# Patient Record
Sex: Female | Born: 1995 | Race: Asian | Hispanic: No | Marital: Single | Smoking: Never smoker
Health system: Southern US, Community
[De-identification: ages and names within clinical notes are randomized; demographics above are authoritative.]

## PROBLEM LIST (undated history)

## (undated) DIAGNOSIS — F419 Anxiety disorder, unspecified: Secondary | ICD-10-CM

## (undated) DIAGNOSIS — F32A Depression, unspecified: Secondary | ICD-10-CM

## (undated) DIAGNOSIS — F329 Major depressive disorder, single episode, unspecified: Secondary | ICD-10-CM

---
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
## 2016-01-01 NOTE — Discharge Instructions (Signed)
Please call your primary care provider in the morning to schedule a follow up appointment. I would also recommend that you call the neurology clinic listed and schedule a follow up appointment with them as well.  Increase hydration.  Return to ER for new or worsening symptoms, any additional concerns.

## 2016-01-29 ENCOUNTER — Encounter: Payer: Self-pay | Admitting: Family Medicine

## 2016-01-29 ENCOUNTER — Ambulatory Visit (INDEPENDENT_AMBULATORY_CARE_PROVIDER_SITE_OTHER): Payer: Medicaid Other | Admitting: Family Medicine

## 2016-01-29 VITALS — BP 106/72 | HR 65 | Temp 98.0°F | Ht 62.0 in | Wt 109.0 lb

## 2016-01-29 DIAGNOSIS — D500 Iron deficiency anemia secondary to blood loss (chronic): Secondary | ICD-10-CM | POA: Diagnosis present

## 2016-01-29 DIAGNOSIS — N921 Excessive and frequent menstruation with irregular cycle: Secondary | ICD-10-CM | POA: Diagnosis not present

## 2016-01-29 LAB — CBC
HEMATOCRIT: 35.1 % (ref 34.0–46.0)
HEMOGLOBIN: 11.3 g/dL — AB (ref 11.5–15.3)
MCH: 22.7 pg — ABNORMAL LOW (ref 25.0–35.0)
MCHC: 32.2 g/dL (ref 31.0–36.0)
MCV: 70.6 fL — AB (ref 78.0–98.0)
MPV: 9.8 fL (ref 7.5–12.5)
Platelets: 227 10*3/uL (ref 140–400)
RBC: 4.97 MIL/uL (ref 3.80–5.10)
RDW: 16.4 % — AB (ref 11.0–15.0)
WBC: 4.1 10*3/uL — ABNORMAL LOW (ref 4.5–13.0)

## 2016-01-29 MED ORDER — NORGESTIMATE-ETH ESTRADIOL 0.25-35 MG-MCG PO TABS: 1.0000 | ORAL_TABLET | Freq: Every day | ORAL | 3 refills | Status: DC

## 2016-01-30 ENCOUNTER — Encounter: Payer: Self-pay | Admitting: Family Medicine

## 2016-01-30 DIAGNOSIS — N921 Excessive and frequent menstruation with irregular cycle: Secondary | ICD-10-CM | POA: Insufficient documentation

## 2016-01-30 DIAGNOSIS — D500 Iron deficiency anemia secondary to blood loss (chronic): Secondary | ICD-10-CM | POA: Insufficient documentation

## 2016-01-30 NOTE — Progress Notes (Signed)
   Subjective:    Patient ID: Beth Conner, female    DOB: 17-Nov-1995, 20 y.o.   MRN: [id number]  HPI Follow-up emergency Department visit for dehydration, anemia. She's feeling better. She has discontinued working at her part-time job as many hours as she had been. She's trying to stay hydrated. She's otherwise doing well.   Review of Systems Denies shortness of breath, no chest pain, no dizziness. Menstrual cycle irregular over the last few months having some bleeding midcycle last 3 months.    Objective:   Physical Exam Vital signs reviewed. GENERAL: Well-developed, well-nourished, no acute distress. CARDIOVASCULAR: Regular rate and rhythm no murmur gallop or rub LUNGS: Clear to auscultation bilaterally, no rales or wheeze. ABDOMEN: Soft positive bowel sounds          Assessment & Plan:

## 2016-01-30 NOTE — Assessment & Plan Note (Signed)
At the emergency department she had low hemoglobin as well as some signs of dehydration. Given her irregular menses recently, we discussed and we'll place her on OCPs. We'll check hemoglobin today .

## 2016-12-02 ENCOUNTER — Encounter: Payer: Self-pay | Admitting: Family Medicine

## 2016-12-02 ENCOUNTER — Ambulatory Visit (INDEPENDENT_AMBULATORY_CARE_PROVIDER_SITE_OTHER): Payer: Medicaid Other | Admitting: Family Medicine

## 2016-12-02 VITALS — BP 106/68 | HR 71 | Temp 98.4°F | Ht 62.5 in | Wt 109.4 lb

## 2016-12-02 DIAGNOSIS — F322 Major depressive disorder, single episode, severe without psychotic features: Secondary | ICD-10-CM

## 2016-12-02 DIAGNOSIS — D500 Iron deficiency anemia secondary to blood loss (chronic): Secondary | ICD-10-CM

## 2016-12-02 DIAGNOSIS — Z0000 Encounter for general adult medical examination without abnormal findings: Secondary | ICD-10-CM | POA: Diagnosis not present

## 2016-12-02 DIAGNOSIS — N921 Excessive and frequent menstruation with irregular cycle: Secondary | ICD-10-CM | POA: Diagnosis not present

## 2016-12-02 NOTE — Patient Instructions (Signed)
You look great! I will send you a note about your blood work. If you change your mind abouit the pills for menorrhagia or contraception, just call and leave me a message and I will call them in (no visit needed). Best of luck over the summer and in school!

## 2016-12-02 NOTE — Progress Notes (Signed)
    CHIEF COMPLAINT / HPI:   Here for checkup. Has just started seeing a new psychiatrist, Dr. Excell SeltzerBaker. She is also just started on 10 mg fluoxetine. She's not currently having any suicidal ideation intent or plan. She is [facility] [employer], living with her parents. 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 When she returns to school she will be a sophomore, likely studying nursing.  She reports no specific issues other than her depression and anxiety.  REVIEW OF SYSTEMS:  Review of Systems  Constitutional: Negative for activity chang; no  appetite change and no unexpected weight change.  Eyes: Negative for eye pain and no visual disturbance.  Neck: denies neck pain; no swallowing problems CV: No chest pain, no shortness of breath, no lower extremity edema. No change in exercise tolerance Respiratory: Negative for cough or wheezing.  No shortness of breath. Gastrointestinal: Negative for abdominal pain, no diarrhea and no  constipation.  Genitourinary: Negative for decreased urine volume and  no difficulty urinating.  Musculoskeletal: Negative for arthralgias. No muscle weakness. Skin: Negative for rash.  Psychiatric/Behavioral: Negative for behavioral problems; no sleep disturbance. Continues to have mild to moderate depression, difficulty focusing, anxiety.   OBJECTIVE:  Vital signs are reviewed.   Vital signs reviewed GENERALl: Well developed, well nourished, in no acute distress. HEENT: PERRLA, EOMI, sclerae are nonicteric NECK: Supple, FROM, without lymphadenopathy.  THYROID: normal without nodularity CAROTID ARTERIES: without bruits LUNGS: clear to auscultation bilaterally. No wheezes or rales. Normal respiratory effort HEART: Regular rate and rhythm, no murmurs. Distal pulses are bilaterally symmetrical, 2+. ABDOMEN: soft with positive bowel sounds. No masses noted MSK: MOE x 4. Normal muscle strength, bulk and tone. SKIN no rash. Normal temperature. NEURO: no focal  deficits. Normal gait. Normal balance.   ASSESSMENT / PLAN: Please see problem oriented charting for details

## 2016-12-02 NOTE — Assessment & Plan Note (Signed)
Had some lab work done by her psychiatrist and was told she needed to have it rechecked. They told her there was some abnormality in the white cells in the red cells. We'll recheck CBC today. She does have history of anemia secondary to menorrhagia.

## 2016-12-02 NOTE — Assessment & Plan Note (Signed)
Reports she has stopped the OCPs. Does not wish to have this currently refilled. Is not currently sexually active and plans no immediate future sexual activity.

## 2016-12-02 NOTE — Assessment & Plan Note (Signed)
[facility] is [facility]. We'll continue to follow up with them.

## 2016-12-03 LAB — CBC
Hematocrit: 35.7 % (ref 34.0–46.6)
Hemoglobin: 11.4 g/dL (ref 11.1–15.9)
MCH: 22.8 pg — AB (ref 26.6–33.0)
MCHC: 31.9 g/dL (ref 31.5–35.7)
MCV: 72 fL — AB (ref 79–97)
Platelets: 252 10*3/uL (ref 150–379)
RBC: 4.99 x10E6/uL (ref 3.77–5.28)
RDW: 17.3 % — ABNORMAL HIGH (ref 12.3–15.4)
WBC: 4.1 10*3/uL (ref 3.4–10.8)

## 2016-12-03 LAB — HIV ANTIBODY (ROUTINE TESTING W REFLEX): HIV Screen 4th Generation wRfx: NONREACTIVE

## 2016-12-04 ENCOUNTER — Encounter: Payer: Self-pay | Admitting: Family Medicine

## 2017-05-09 ENCOUNTER — Encounter (HOSPITAL_COMMUNITY): Payer: Self-pay | Admitting: Emergency Medicine

## 2017-05-09 ENCOUNTER — Emergency Department (HOSPITAL_COMMUNITY)
Admission: EM | Admit: 2017-05-09 | Discharge: 2017-05-09 | Disposition: A | Discharge status: Home / Self Care | Payer: Medicaid Other | Attending: Emergency Medicine | Admitting: Emergency Medicine

## 2017-05-09 DIAGNOSIS — Z79899 Other long term (current) drug therapy: Secondary | ICD-10-CM | POA: Insufficient documentation

## 2017-05-09 DIAGNOSIS — R319 Hematuria, unspecified: Secondary | ICD-10-CM | POA: Diagnosis present

## 2017-05-09 DIAGNOSIS — N3001 Acute cystitis with hematuria: Secondary | ICD-10-CM | POA: Insufficient documentation

## 2017-05-09 LAB — URINALYSIS, ROUTINE W REFLEX MICROSCOPIC
Bilirubin Urine: NEGATIVE
Glucose, UA: NEGATIVE mg/dL
Ketones, ur: NEGATIVE mg/dL
Nitrite: NEGATIVE
PROTEIN: 100 mg/dL — AB
Specific Gravity, Urine: 1.024 (ref 1.005–1.030)
pH: 6 (ref 5.0–8.0)

## 2017-05-09 LAB — WET PREP, GENITAL
CLUE CELLS WET PREP: NONE SEEN
SPERM: NONE SEEN
TRICH WET PREP: NONE SEEN
YEAST WET PREP: NONE SEEN

## 2017-05-09 LAB — RAPID HIV SCREEN (HIV 1/2 AB+AG)
HIV 1/2 Antibodies: NONREACTIVE
HIV-1 P24 ANTIGEN - HIV24: NONREACTIVE

## 2017-05-09 LAB — POC URINE PREG, ED: PREG TEST UR: NEGATIVE

## 2017-05-09 MED ORDER — PHENAZOPYRIDINE HCL 200 MG PO TABS: 200.0000 mg | ORAL_TABLET | Freq: Three times a day (TID) | ORAL | 0 refills | Status: AC

## 2017-05-09 MED ORDER — CEPHALEXIN 500 MG PO CAPS: 500.0000 mg | ORAL_CAPSULE | Freq: Two times a day (BID) | ORAL | 0 refills | Status: AC

## 2017-05-09 NOTE — ED Notes (Signed)
Declined W/C at D/C and was escorted to lobby by RN. 

## 2017-05-09 NOTE — ED Provider Notes (Signed)
[facility] EMERGENCY DEPARTMENT Provider Note   CSN: [id number] Arrival date & time: 05/09/17  1416     History   Chief Complaint Chief Complaint  Patient presents with  . Hematuria  . Urinary Tract Infection    HPI Beth Conner is a 21 y.o. female.  HPI   Beth Conner a 21 year old female with a history of depression, anxiety, allergies who presents the emergency department for evaluation of urinary frequency, dysuria and hematuria.  She states that she has had increased urinary frequency for the past 2 days with little urinary output.  Yesterday she noticed that there was some blood in the urine which concerned her.  She also endorses 5/10 constant cramping suprapubic pain.  She denies fever, flank pain, nausea/vomiting, diarrhea, vaginal discharge. She denies history of UTI.  Last menstrual period finished a week ago.  States that she recently became sexually active having sex with one female partner twice last week.  She reports condom use every time.  Is asking to be checked for STDs and pregnancy.  Past Medical History:  Diagnosis Date  . Anxiety   . Depression   . MDD (major depressive disorder)     Patient Active Problem List   Diagnosis Date Noted  . Anemia due to chronic blood loss 01/30/2016  . Menometrorrhagia 01/30/2016  . Parent-child relational problem 08/04/2013  . Anxiety disorder 08/04/2013  . MDD (major depressive disorder), single episode, severe (HCC) 07/29/2013  . Acne 07/13/2012  . Visit for preventive health examination 06/24/2011  . RHINITIS, ALLERGIC 09/02/2006    No past surgical history on file.  OB History    No data available       Home Medications    Prior to Admission medications   Medication Sig Start Date End Date Taking? Authorizing Provider  FLUoxetine (PROZAC) 10 MG capsule Take 10 mg by mouth every morning. 11/19/16   [provider]    Family History Family History  Problem Relation Age of Onset  .  Mental illness Father     Social History Social History   Tobacco Use  . Smoking status: Never Smoker  . Smokeless tobacco: Never Used  Substance Use Topics  . Alcohol use: No  . Drug use: No     Allergies   Patient has no known allergies.   Review of Systems Review of Systems  Constitutional: Negative for chills, fatigue and fever.  Respiratory: Negative for shortness of breath.   Cardiovascular: Negative for chest pain.  Gastrointestinal: Positive for abdominal pain (suprapubic pain). Negative for diarrhea, nausea and vomiting.  Genitourinary: Positive for dysuria, frequency and hematuria. Negative for difficulty urinating, flank pain, menstrual problem, vaginal bleeding and vaginal discharge.  Musculoskeletal: Negative for gait problem.  Skin: Negative for rash.  Neurological: Negative for headaches.  Psychiatric/Behavioral: Negative for agitation.  All other systems reviewed and are negative.    Physical Exam Updated Vital Signs BP 123/78 (BP Location: Left Arm)   Pulse 68   Temp 97.9 F (36.6 C) (Oral)   Resp 14   Ht 5\' 2"  (1.575 m)   Wt 49 kg (108 lb)   SpO2 100%   BMI 19.75 kg/m   Physical Exam  Constitutional: She is oriented to person, place, and time. She appears well-developed and well-nourished. No distress.  Non-toxic appearing in no acute distress.  Sitting at the bedside, answering questions.  HENT:  Head: Normocephalic and atraumatic.  Mouth/Throat: Oropharynx is clear and moist.  Eyes: Conjunctivae are  normal. Pupils are equal, round, and reactive to light. Right eye exhibits no discharge. Left eye exhibits no discharge.  Neck: Normal range of motion. Neck supple.  Cardiovascular: Normal rate, regular rhythm and intact distal pulses. Exam reveals no friction rub.  No murmur heard. Pulmonary/Chest: Effort normal and breath sounds normal. No respiratory distress. She has no wheezes. She has no rales.  Abdominal: Soft. Bowel sounds are normal.  There is no tenderness. There is no rebound and no guarding.  No CVA tenderness.  Genitourinary:  Genitourinary Comments: Chaperone present for exam. Milky white discharge noted in the vaginal vault. No CMT. No adnexal masses, tenderness, or fullness.  No bleeding within vaginal vault.  Musculoskeletal: Normal range of motion.  Lymphadenopathy:    She has no cervical adenopathy.  Neurological: She is alert and oriented to person, place, and time. Coordination normal.  Skin: Skin is warm and dry. She is not diaphoretic.  Psychiatric: She has a normal mood and affect. Her behavior is normal.     ED Treatments / Results  Labs (all labs ordered are listed, but only abnormal results are displayed) Labs Reviewed  URINALYSIS, ROUTINE W REFLEX MICROSCOPIC - Abnormal; Notable for the following components:      Result Value   APPearance CLOUDY (*)    Hgb urine dipstick LARGE (*)    Protein, ur 100 (*)    Leukocytes, UA LARGE (*)    Bacteria, UA FEW (*)    Squamous Epithelial / LPF 0-5 (*)    Non Squamous Epithelial 0-5 (*)    All other components within normal limits  WET PREP, GENITAL  RAPID HIV SCREEN (HIV 1/2 AB+AG)  RPR  POC URINE PREG, ED  GC/CHLAMYDIA PROBE AMP ([place]) NOT AT [facility]    EKG  EKG Interpretation None       Radiology No results found.  Procedures Procedures (including critical care time)  Medications Ordered in ED Medications - No data to display   Initial Impression / Assessment and Plan / ED Course  I have reviewed the triage vital signs and the nursing notes.  Pertinent labs & imaging results that were available during my care of the patient were reviewed by me and considered in my medical decision making (see chart for details).    Patient presents with urinary frequency, dysuria and hematuria.  She denies fever, flank pain.  No CVA tenderness on exam.  UA infected  (cloudy with too numerous to count WBCs and RBCs.)  Urine pregnancy negative.   We will treat for UTI with antibiotics and pyridium.  Exam non-concerning for pyelonephritis given no CVA tenderness, report of flank pain or fever.  Patient has recently become sexually active and is requesting STD testing. Pelvic exam with some white milky discharge, no CMT or adnexal tenderness to suggest PID.  Sign out given to Dr. Ethelda ChickJacubowitz with wet prep pending.   Final Clinical Impressions(s) / ED Diagnoses   Final diagnoses:  None    New Prescriptions This SmartLink is deprecated. Use AVSMEDLIST instead to display the medication list for a patient.   Kellie ShropshireShrosbree, [place] Hagger J, PA-C 05/10/17 1125    Doug SouJacubowitz, Sam, MD 05/10/17 1407

## 2017-05-09 NOTE — ED Triage Notes (Signed)
Pt states 2 days of frequent small amounts of urination with blood in urine. Denies vaginal discharge or vaginal bleeding. Afebrile. No flank pain.

## 2017-05-09 NOTE — Discharge Instructions (Addendum)
Your urine is infected.  We will treat you for a bladder infection with antibiotic.  Please fill this prescription and take 2 times a day for the next 7 days.  Please take all of your antibiotics until finished!   You may develop abdominal discomfort or diarrhea from the antibiotic.  You may help offset this with probiotics which you can buy or get in yogurt. Do not eat  or take the probiotics until 2 hours after your antibiotic.  See Dr. Jennette KettleNeal if not improving in 4 or 5 days or you can return to the emergency department if concern for any reason  I also wrote you a prescription for pyridium which is a medicine that helps with painful urination. Please take this tree times a day for the next two days.   The pregnancy test was negative.  You have chlamydia, gonorrhea and syphilis testing which is pending.  If these tests are positive you will get a phone call within the next 3 days, you will not receive a call if they are negative.  Please return to the emergency department if you develop a fever greater than 100.4 F despite taking antibiotics, have worsening stomach pain with vomiting that will not stop or have any new or worsening symptoms.

## 2017-05-09 NOTE — ED Provider Notes (Signed)
Planes of painful urination and burning at urethral meatus with urination.  No other complaint.  Patient alert well-appearing no distress Results for orders placed or performed during the hospital encounter of 05/09/17  Wet prep, genital  Result Value Ref Range   Yeast Wet Prep HPF POC NONE SEEN NONE SEEN   Trich, Wet Prep NONE SEEN NONE SEEN   Clue Cells Wet Prep HPF POC NONE SEEN NONE SEEN   WBC, Wet Prep HPF POC MANY (A) NONE SEEN   Sperm NONE SEEN   Urinalysis, Routine w reflex microscopic- may I&O cath if menses  Result Value Ref Range   Color, Urine YELLOW YELLOW   APPearance CLOUDY (A) CLEAR   Specific Gravity, Urine 1.024 1.005 - 1.030   pH 6.0 5.0 - 8.0   Glucose, UA NEGATIVE NEGATIVE mg/dL   Hgb urine dipstick LARGE (A) NEGATIVE   Bilirubin Urine NEGATIVE NEGATIVE   Ketones, ur NEGATIVE NEGATIVE mg/dL   Protein, ur 119100 (A) NEGATIVE mg/dL   Nitrite NEGATIVE NEGATIVE   Leukocytes, UA LARGE (A) NEGATIVE   RBC / HPF TOO NUMEROUS TO COUNT 0 - 5 RBC/hpf   WBC, UA TOO NUMEROUS TO COUNT 0 - 5 WBC/hpf   Bacteria, UA FEW (A) NONE SEEN   Squamous Epithelial / LPF 0-5 (A) NONE SEEN   WBC Clumps PRESENT    Mucus PRESENT    Non Squamous Epithelial 0-5 (A) NONE SEEN  POC urine preg, ED  Result Value Ref Range   Preg Test, Ur NEGATIVE NEGATIVE   No results found.   Doug SouJacubowitz, Isael Stille, MD 05/09/17 1755

## 2017-05-10 LAB — GC/CHLAMYDIA PROBE AMP (~~LOC~~) NOT AT ARMC
CHLAMYDIA, DNA PROBE: NEGATIVE
Neisseria Gonorrhea: NEGATIVE

## 2017-05-10 LAB — RPR: RPR Ser Ql: NONREACTIVE

## 2017-11-24 ENCOUNTER — Ambulatory Visit (INDEPENDENT_AMBULATORY_CARE_PROVIDER_SITE_OTHER): Payer: Medicaid Other | Admitting: Family Medicine

## 2017-11-24 ENCOUNTER — Encounter: Payer: Self-pay | Admitting: Family Medicine

## 2017-11-24 ENCOUNTER — Other Ambulatory Visit: Payer: Self-pay

## 2017-11-24 VITALS — BP 94/62 | HR 63 | Temp 98.1°F | Ht 63.0 in | Wt 108.2 lb

## 2017-11-24 DIAGNOSIS — M545 Low back pain, unspecified: Secondary | ICD-10-CM

## 2017-11-24 NOTE — Progress Notes (Signed)
    CHIEF COMPLAINT / HPI: Back pain for the last 2 months.  Midline low back pain.  Worse with extended periods of sitting.  Feels stiff.  Exact area pain seems to move around her left to right sometimes it is tender to touch.  No bowel or bladder changes.  No leg numbness.  No lower extremity weakness.  REVIEW OF SYSTEMS: See HPI  PERTINENT  PMH / PSH: I have reviewed the patient's medications, allergies, past medical and surgical history, smoking status and updated in the EMR as appropriate.   OBJECTIVE: GENERAL: Well-developed female no acute distress BACK: No defect is noted.  Mildly tender to palpation diffusely in the lumbar muscle area.  The lumbar vertebral are nontender to percussion.  She has normal flexion at the hips.  She has some mild pain with hyperextension but has full range of motion.  Lateral rotation does not cause pain.  She can bend over and almost touch her toes but this causes a pulling sensation in her low back.   EXTREMITY: Lower extremity strength 5 out of 5 flexors and extensors of the hip, knee, ankle symmetrically.  DTR: 2+ bilateral symmetrical knee and ankle. SKIN: Skin of the lower back was without any unusual warmth, no redness, no rash.  ASSESSMENT / PLAN: Musculoskeletal low back pain probably related to long periods of sitting she is a very Freight forwarder.  We discussed at length.  Her father was present for the entire office visit.  I gave her handout on back exercises focusing on hyperextension activities.  She has been using some over-the-counter Aleve for that occasionally and that is fine stomach upset. If she is  Diligent with the exercises she should improve in the next 2 to 4 weeks.  If the symptoms are not resolving as we suspect, she will return to clinic.

## 2018-12-02 ENCOUNTER — Ambulatory Visit (INDEPENDENT_AMBULATORY_CARE_PROVIDER_SITE_OTHER): Payer: Self-pay | Admitting: *Deleted

## 2018-12-02 ENCOUNTER — Other Ambulatory Visit: Payer: Self-pay

## 2018-12-02 DIAGNOSIS — Z111 Encounter for screening for respiratory tuberculosis: Secondary | ICD-10-CM

## 2018-12-02 NOTE — Progress Notes (Signed)
Patient is here for a PPD placement.  PPD placed in Right forearm (scars on left arm).  Patient will return 12/05/18 to have PPD read. Jone Baseman, CMA

## 2018-12-05 ENCOUNTER — Other Ambulatory Visit: Payer: Self-pay

## 2018-12-05 ENCOUNTER — Ambulatory Visit (INDEPENDENT_AMBULATORY_CARE_PROVIDER_SITE_OTHER): Payer: Self-pay | Admitting: *Deleted

## 2018-12-05 ENCOUNTER — Ambulatory Visit (INDEPENDENT_AMBULATORY_CARE_PROVIDER_SITE_OTHER): Payer: Self-pay | Admitting: Family Medicine

## 2018-12-05 ENCOUNTER — Encounter: Payer: Self-pay | Admitting: Family Medicine

## 2018-12-05 VITALS — BP 85/70 | HR 75

## 2018-12-05 DIAGNOSIS — R590 Localized enlarged lymph nodes: Secondary | ICD-10-CM

## 2018-12-05 DIAGNOSIS — Z111 Encounter for screening for respiratory tuberculosis: Secondary | ICD-10-CM

## 2018-12-05 DIAGNOSIS — L219 Seborrheic dermatitis, unspecified: Secondary | ICD-10-CM

## 2018-12-05 DIAGNOSIS — Z021 Encounter for pre-employment examination: Secondary | ICD-10-CM

## 2018-12-05 LAB — TB SKIN TEST
Induration: 0 mm
TB Skin Test: NEGATIVE

## 2018-12-05 MED ORDER — KETOCONAZOLE 2 % EX SHAM
1.0000 | MEDICATED_SHAMPOO | CUTANEOUS | 0 refills | Status: AC
Start: 2018-12-05 — End: ?

## 2018-12-05 NOTE — Progress Notes (Signed)
Patient Name: Beth Conner Date of Birth: 06/29/1996 Date of Visit: 12/05/18 PCP: Nestor RampNeal, Sara L, MD  Chief Complaint: Lymph node on left side of neck  Subjective: Beth Conner is a pleasant 23 y.o. with medical history significant for anxiety presenting today for two weeks of lymphadenopathy, localized.  The patient reports a 2-week history of left-sided swollen " glands".  She reports these are painless and minimally tender.  No erythema associated with them, endorses a sore feeling over the area. She specifically denies ear pain, sore throat, dental infection, fevers, chills, shortness of breath cough.  She was sexually active in January with one female partner.  She regularly uses condoms.  No oral intercourse.  No IV drug use.  Denies smoking tobacco or marijuana products.  She drinks alcohol every 2 to 3 months.  She has no cats at home.  No recent travel.  Her main other concern is that she has had 2 months of flakes in her hair.  This is worsened over the last month where her scalp is very irritated and tender at times.  She has used a number of products of this.  She regularly uses Pantene shampoo and also uses matrix hair products to lock in coloring products.   The patient is currently working as a Conservator, museum/gallerycertified nurse assistant.  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 She recently had PPD placed in right arm.    ROS: As above.  ROS  I have reviewed the patient's medical, surgical, family, and social history as appropriate.   Vitals:   12/05/18 0918  BP: (!) 85/70  Pulse: 75  SpO2: 98%   Temp 98.1 F  HEENT: Sclera anicteric. Dentition is moderate. Appears well hydrated.  Oropharynx moderate dentition.  She does have a dental carry on the right side on the mandibular surface.  Tympanic membrane's both clear without effusion.  Oropharynx without exudate.  neck: 1.5 cm lymph node in posterior cervical chain, left sided, mobile, minimally tender, no erythema  There is a small 0.5 cm lymph node in the right anterior cervical lymph chain.  No axillary adenopathy no epitrochlear lymph nodes Cardiac: Regular rate and rhythm. Normal S1/S2. No murmurs, rubs, or gallops appreciated. Lungs: Clear bilaterally to ascultation.  Abdomen: Normoactive bowel sounds. No tenderness to deep or light palpation. No rebound or guarding.  Extremities: Warm, well perfused without edema.  Skin: Warm, dry Psych: Pleasant and appropriate    Ghazal was seen today for lumps on neck.  Diagnoses and all orders for this visit:  Lymphadenopathy, cervical most likely reactive lymph adenopathy in the setting of quite moderate to severe seborrhea.  Differential also includes common bacterial pathogens as this is only been ongoing for 2 weeks of time.  Differential must also include typical immunosuppressive viruses.  Epstein-Barr virus certainly possible as well.  Will start with basic lab evaluation as above.  She is to return to care for reexamination in 2 weeks.  If this is still present consider testing for Epstein-Barr virus and further evaluation and potentially imaging. -     HIV antibody (with reflex) -     RPR -     Hepatitis c antibody (reflex) -     CBC with Differential  Seborrhea -     ketoconazole (NIZORAL) 2 % shampoo; Apply 1 application topically 2 (two) times a week. Leave on three to five minutes then rinse  Need for history and physical examination for employment -  Hepatitis B surface antibody,quantitative   Terisa Starr, MD  [facility] Medicine Teaching Service

## 2018-12-05 NOTE — Patient Instructions (Addendum)
It was wonderful to see you today.  Thank you for choosing [facility].   Please call [phone] with any questions about today's appointment.  Please be sure to schedule follow up at the front  desk before you leave today.   Terisa Starr, MD  Family Medicine     Your prescription is at your pharmacy  I will call you with labs results

## 2018-12-05 NOTE — Progress Notes (Signed)
Patient is here for a PPD read.  It was placed on 12/01/17 in the right forearm (due to scaring on the left) @ 2:10pm.    PPD RESULTS:  Result: Negative Induration: 0 mm  Letter created and given to patient for documentation purposes. Jone Baseman, CMA

## 2018-12-05 NOTE — Progress Notes (Deleted)
  Patient Name: Kiersti Faul Date of Birth: 10/01/1995 Date of Visit: 12/05/18 PCP: Nestor Ramp, MD  Chief Complaint:   Subjective: Nazli Caseres is a pleasant 23 y.o. with medical history significant for *** presenting today for ***.     ROS:  ROS  I have reviewed the patient's medical, surgical, family, and social history as appropriate.   Vitals:   12/05/18 0918  BP: (!) 85/70  Pulse: 75  SpO2: 98%   There were no vitals filed for this visit.   Clemmie was seen today for lumps on neck.  Diagnoses and all orders for this visit:  Lymphadenopathy, cervical -     HIV antibody (with reflex) -     RPR -     Hepatitis c antibody (reflex) -     CBC with Differential  Seborrhea -     ketoconazole (NIZORAL) 2 % shampoo; Apply 1 application topically 2 (two) times a week. Leave on three to five minutes then rinse  Need for history and physical examination for employment -     Hepatitis B surface antibody,quantitative    Terisa Starr, MD  [facility] Medicine Teaching Service

## 2018-12-06 ENCOUNTER — Other Ambulatory Visit: Payer: Self-pay | Admitting: Family Medicine

## 2018-12-06 ENCOUNTER — Telehealth: Payer: Self-pay | Admitting: Family Medicine

## 2018-12-06 DIAGNOSIS — Z9229 Personal history of other drug therapy: Secondary | ICD-10-CM

## 2018-12-06 LAB — CBC WITH DIFFERENTIAL/PLATELET
Basophils Absolute: 0 10*3/uL (ref 0.0–0.2)
Basos: 1 %
EOS (ABSOLUTE): 0.1 10*3/uL (ref 0.0–0.4)
Eos: 3 %
Hematocrit: 39.9 % (ref 34.0–46.6)
Hemoglobin: 13 g/dL (ref 11.1–15.9)
Immature Grans (Abs): 0 10*3/uL (ref 0.0–0.1)
Immature Granulocytes: 0 %
Lymphocytes Absolute: 1.5 10*3/uL (ref 0.7–3.1)
Lymphs: 41 %
MCH: 24.4 pg — ABNORMAL LOW (ref 26.6–33.0)
MCHC: 32.6 g/dL (ref 31.5–35.7)
MCV: 75 fL — ABNORMAL LOW (ref 79–97)
Monocytes Absolute: 0.2 10*3/uL (ref 0.1–0.9)
Monocytes: 5 %
Neutrophils Absolute: 1.9 10*3/uL (ref 1.4–7.0)
Neutrophils: 50 %
Platelets: 210 10*3/uL (ref 150–450)
RBC: 5.32 x10E6/uL — ABNORMAL HIGH (ref 3.77–5.28)
RDW: 15.3 % (ref 11.7–15.4)
WBC: 3.7 10*3/uL (ref 3.4–10.8)

## 2018-12-06 LAB — HEPATITIS B SURFACE ANTIBODY, QUANTITATIVE: Hepatitis B Surf Ab Quant: <3.1 m[IU]/mL — ABNORMAL LOW (ref >9.9)

## 2018-12-06 LAB — RPR: RPR Ser Ql: NONREACTIVE

## 2018-12-06 LAB — HIV ANTIBODY (ROUTINE TESTING W REFLEX): HIV Screen 4th Generation wRfx: NONREACTIVE

## 2018-12-06 LAB — HEPATITIS C ANTIBODY (REFLEX): HCV Ab: <0.1 s/co ratio (ref 0.0–0.9)

## 2018-12-06 LAB — HCV COMMENT:

## 2018-12-06 NOTE — Telephone Encounter (Signed)
Called patient with results. All results reassuring microcytosis is present which is been present previously, this is likely due to iron deficiency or possibly a thalassemia trait.  There is no anemia.  Recommend an iron rich diet.  HIV syphilis and hepatitis C all negative no suggestions of abnormal white blood cell count suggestive of infection.  Recommended treatment of seborrhea.  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  The patient will need a vaccine for this.  The vaccine is been ordered  Nursing please call the patient to schedule her visit for her hepatitis B vaccine.  She will bring the paperwork with her -- please make a copy and place in my box.  Let me know if you have questions.   Terisa Starr, MD  Family Medicine Teaching Service

## 2018-12-06 NOTE — Progress Notes (Signed)
Hep B vaccine ordered.

## 2018-12-07 ENCOUNTER — Other Ambulatory Visit: Payer: Self-pay

## 2018-12-07 ENCOUNTER — Telehealth: Payer: Self-pay | Admitting: *Deleted

## 2018-12-07 ENCOUNTER — Ambulatory Visit (INDEPENDENT_AMBULATORY_CARE_PROVIDER_SITE_OTHER): Payer: Self-pay | Admitting: *Deleted

## 2018-12-07 VITALS — Temp 98.4°F

## 2018-12-07 DIAGNOSIS — Z9229 Personal history of other drug therapy: Secondary | ICD-10-CM

## 2018-12-07 DIAGNOSIS — Z23 Encounter for immunization: Secondary | ICD-10-CM

## 2018-12-07 NOTE — Telephone Encounter (Signed)
Repeat titer ordered. Thank you for counting out the time/date with the patient.   Terisa Starr, MD  Family Medicine Teaching Service

## 2018-12-07 NOTE — Progress Notes (Signed)
Pt came in for Hep B booster due to no immunity.  Instructed pt that she needed to wait 4 weeks to come back for second dose.  She stated that she would do a second titer to see if this shot gives her immunity and if not will get the second done around first week in July.  Routing a message to doctor who ordered original titer and have them place a future order for another one to be taken. Beth Conner, CMA

## 2018-12-07 NOTE — Addendum Note (Signed)
Addended by: Manson Passey, CARINA on: 12/07/2018 01:34 PM   Modules accepted: Orders

## 2018-12-07 NOTE — Telephone Encounter (Signed)
Pt in today for Hepatitis B immunization in hope of reaching immunity. Pt will come in in 4 weeks for another titer. Will you please place a future lab order for this.  The plan is to check titer right at 4 wks for immunity (1st week in July) if no immunity 2nd dose to be given week of 7/6-7/10, in hopes of immunity to be checked again 4-5 weeks later so pt can possibly start program in August. Ezariah Nace Lamonte Sakai, CMA

## 2018-12-21 ENCOUNTER — Encounter: Payer: Self-pay | Admitting: Family Medicine

## 2018-12-21 ENCOUNTER — Other Ambulatory Visit: Payer: Self-pay

## 2018-12-21 ENCOUNTER — Ambulatory Visit (INDEPENDENT_AMBULATORY_CARE_PROVIDER_SITE_OTHER): Payer: Self-pay | Admitting: Family Medicine

## 2018-12-21 VITALS — BP 98/58 | HR 73 | Temp 98.1°F | Ht 62.0 in | Wt 108.0 lb

## 2018-12-21 DIAGNOSIS — Z23 Encounter for immunization: Secondary | ICD-10-CM

## 2018-12-21 DIAGNOSIS — Z0000 Encounter for general adult medical examination without abnormal findings: Secondary | ICD-10-CM

## 2018-12-21 NOTE — Patient Instructions (Signed)
You can come for a nurse visit at the first week of July and get your second Tb test for the two step test your school requires. At that nurse visit go ahead and get your lab drawn for the hepatitis B titer. Once that is drawn I will send you a note.  Be sure and bring your form for school to be signed when you get the second TB test read. Great  To see you!

## 2018-12-26 DIAGNOSIS — Z0000 Encounter for general adult medical examination without abnormal findings: Secondary | ICD-10-CM | POA: Insufficient documentation

## 2018-12-26 NOTE — Progress Notes (Signed)
    CHIEF COMPLAINT / HPI: Needs paperwork filled out for school.  Has some questions about Hepatitus B titers.  REVIEW OF SYSTEMS: Review of Systems  Constitutional: Negative for activity chang; no  appetite change and no unexpected weight change.  Eyes: Negative for eye pain and no visual disturbance.  Neck: denies neck pain; no swallowing problems CV: No chest pain, no shortness of breath, no lower extremity edema. No change in exercise tolerance Respiratory: Negative for cough or wheezing.  No shortness of breath. Gastrointestinal: Negative for abdominal pain, no diarrhea and no  constipation.  Genitourinary: Negative for decreased urine volume and  no difficulty urinating.  Musculoskeletal: Negative for arthralgias. No muscle weakness. Skin: Negative for rash.  Psychiatric/Behavioral: Negative for behavioral problems; no sleep disturbance and no  agitation.    PERTINENT  PMH / PSH: I have reviewed the patient's medications, allergies, past medical and surgical history, smoking status and updated in the EMR as appropriate.   OBJECTIVE:  Vital signs reviewed GENERALl: Well developed, well nourished, in no acute distress. HEENT: PERRLA, EOMI, sclerae are nonicteric NECK: Supple, FROM, without lymphadenopathy.  THYROID: normal without nodularity CAROTID ARTERIES: without bruits LUNGS: clear to auscultation bilaterally. No wheezes or rales. Normal respiratory effort HEART: Regular rate and rhythm, no murmurs. Distal pulses are bilaterally symmetrical, 2+. ABDOMEN: soft with positive bowel sounds. No masses noted MSK: MOE x 4. Normal muscle strength, bulk and tone. SKIN no rash. Normal temperature. NEURO: no focal deficits. Normal gait. Normal balance. PSYCH: AxOx4. Good eye contact.. No psychomotor retardation or agitation. Appropriate speech fluency and content. Asks and answers questions appropriately. Mood is congruent.   ASSESSMENT / PLAN:  No problem-specific Assessment &  Plan notes found for this encounter.

## 2018-12-26 NOTE — Assessment & Plan Note (Signed)
Filled out her college paperwork.  Arrange for her to get second titer and two-step TB test

## 2019-01-16 ENCOUNTER — Ambulatory Visit (INDEPENDENT_AMBULATORY_CARE_PROVIDER_SITE_OTHER): Payer: Self-pay | Admitting: *Deleted

## 2019-01-16 ENCOUNTER — Other Ambulatory Visit: Payer: Self-pay

## 2019-01-16 DIAGNOSIS — Z9229 Personal history of other drug therapy: Secondary | ICD-10-CM

## 2019-01-16 DIAGNOSIS — Z0000 Encounter for general adult medical examination without abnormal findings: Secondary | ICD-10-CM

## 2019-01-16 DIAGNOSIS — Z111 Encounter for screening for respiratory tuberculosis: Secondary | ICD-10-CM

## 2019-01-16 NOTE — Progress Notes (Signed)
Pt is here for the 2nd part of the 2 step TB test and also to have Hep B Titer (See phone note from 12/07/18).  PPD placed in right forearm (due to scarring on left arm).  Patient will return 01/18/19 to have PPD read. Christen Bame, CMA

## 2019-01-17 LAB — HEPATITIS B SURFACE ANTIBODY, QUANTITATIVE: Hepatitis B Surf Ab Quant: >1000 m[IU]/mL (ref >9.9)

## 2019-01-17 LAB — HEPATITIS B SURFACE ANTIBODY,QUALITATIVE: Hep B Surface Ab, Qual: REACTIVE

## 2019-01-18 ENCOUNTER — Other Ambulatory Visit: Payer: Self-pay

## 2019-01-18 ENCOUNTER — Ambulatory Visit: Payer: Medicaid Other

## 2019-01-18 DIAGNOSIS — Z111 Encounter for screening for respiratory tuberculosis: Secondary | ICD-10-CM

## 2019-01-18 LAB — TB SKIN TEST
Induration: 0 mm
TB Skin Test: NEGATIVE

## 2019-01-18 NOTE — Progress Notes (Signed)
PPD Reading Note PPD read and results entered in [place]. Result: 0 mm induration. Interpretation: Negative Allergic reaction: No  Labs printed from HepB titers.

## 2019-01-31 ENCOUNTER — Ambulatory Visit: Payer: Medicaid Other | Admitting: *Deleted

## 2019-01-31 ENCOUNTER — Other Ambulatory Visit: Payer: Self-pay

## 2019-02-06 ENCOUNTER — Encounter: Payer: Self-pay | Admitting: Family Medicine

## 2019-12-25 ENCOUNTER — Telehealth: Payer: Self-pay | Admitting: Family Medicine

## 2019-12-25 DIAGNOSIS — Z111 Encounter for screening for respiratory tuberculosis: Secondary | ICD-10-CM

## 2019-12-25 NOTE — Telephone Encounter (Signed)
Patient is calling and would like to know if Dr. Jennette Kettle could place an order for her to have quantiferon gold test done for her employer.   Once the order is placed she would like someone to call her so she can get this appointment scheduled.   The best callback is [phone].

## 2019-12-25 NOTE — Telephone Encounter (Signed)
Dear [facility] Asters Team Please let her know the order is placed and she can come for a lab only appointment. THANKS! Denny Levy

## 2019-12-26 ENCOUNTER — Other Ambulatory Visit: Payer: Self-pay

## 2019-12-26 ENCOUNTER — Other Ambulatory Visit: Payer: Medicaid Other

## 2019-12-26 DIAGNOSIS — Z111 Encounter for screening for respiratory tuberculosis: Secondary | ICD-10-CM

## 2019-12-26 NOTE — Telephone Encounter (Signed)
Contacted pt and informed her of below and she said that she came in today to have that drawn.Beth Conner, CMA

## 2019-12-28 LAB — QUANTIFERON-TB GOLD PLUS
QuantiFERON Mitogen Value: >10 IU/mL
QuantiFERON Nil Value: 0.09 IU/mL
QuantiFERON TB1 Ag Value: 0.12 IU/mL
QuantiFERON TB2 Ag Value: 0.13 IU/mL
QuantiFERON-TB Gold Plus: NEGATIVE

## 2019-12-29 ENCOUNTER — Encounter: Payer: Self-pay | Admitting: Family Medicine

## 2020-11-18 ENCOUNTER — Other Ambulatory Visit: Payer: Self-pay

## 2020-11-18 ENCOUNTER — Encounter (HOSPITAL_COMMUNITY): Payer: Self-pay

## 2020-11-18 ENCOUNTER — Ambulatory Visit (HOSPITAL_COMMUNITY)
Admission: EM | Admit: 2020-11-18 | Discharge: 2020-11-18 | Disposition: A | Discharge status: Home / Self Care | Payer: 59 | Attending: Physician Assistant | Admitting: Physician Assistant

## 2020-11-18 DIAGNOSIS — J40 Bronchitis, not specified as acute or chronic: Secondary | ICD-10-CM

## 2020-11-18 DIAGNOSIS — J329 Chronic sinusitis, unspecified: Secondary | ICD-10-CM

## 2020-11-18 DIAGNOSIS — R059 Cough, unspecified: Secondary | ICD-10-CM

## 2020-11-18 MED ORDER — PREDNISONE 10 MG (21) PO TBPK: ORAL_TABLET | ORAL | 0 refills | Status: AC

## 2020-11-18 MED ORDER — ALBUTEROL SULFATE HFA 108 (90 BASE) MCG/ACT IN AERS
1.0000 | INHALATION_SPRAY | Freq: Four times a day (QID) | RESPIRATORY_TRACT | 0 refills | Status: AC | PRN
Start: 2020-11-18 — End: ?

## 2020-11-18 MED ORDER — AMOXICILLIN-POT CLAVULANATE 875-125 MG PO TABS: 1.0000 | ORAL_TABLET | Freq: Two times a day (BID) | ORAL | 0 refills | Status: AC

## 2020-11-18 NOTE — ED Triage Notes (Addendum)
Pt in with c/o productive cough and wheezing/sob when she inhales x 1 week  Pt has been taking Claritin with no relief  Expiratory wheezing noted

## 2020-11-18 NOTE — ED Provider Notes (Signed)
[facility]    CSN: [id number] Arrival date & time: 11/18/20  1053      History   Chief Complaint Chief Complaint  Patient presents with  . Cough  . Shortness of Breath    HPI Beth Conner is a 25 y.o. female.   Patient presents today with a several week history of productive cough.  Reports sputum as thick and dark.  She reports associated sinus pressure and shortness of breath.  She has experienced some wheezing.  She denies any fever, nausea, vomiting, headache, dizziness, chest pain.  She has tried over-the-counter medications without improvement of symptoms.  Denies any known sick contacts.  She is up-to-date on immunizations including flu and COVID.  She denies any recent antibiotics.  Denies history of asthma, COPD, smoking, allergies.  Reports symptoms are interfering with her ability perform daily activities and she has having difficulty sleeping at night due to symptoms.     Past Medical History:  Diagnosis Date  . Anxiety   . Depression   . MDD (major depressive disorder)     Patient Active Problem List   Diagnosis Date Noted  . Well adult exam 12/26/2018  . Anemia due to chronic blood loss 01/30/2016  . Menometrorrhagia 01/30/2016  . Parent-child relational problem 08/04/2013  . Anxiety disorder 08/04/2013  . MDD (major depressive disorder), single episode, severe (HCC) 07/29/2013  . Acne 07/13/2012  . Visit for preventive health examination 06/24/2011  . RHINITIS, ALLERGIC 09/02/2006    History reviewed. No pertinent surgical history.  OB History   No obstetric history on file.      Home Medications    Prior to Admission medications   Medication Sig Start Date End Date Taking? Authorizing Provider  albuterol (VENTOLIN HFA) 108 (90 Base) MCG/ACT inhaler Inhale 1-2 puffs into the lungs every 6 (six) hours as needed for wheezing or shortness of breath. 11/18/20  Yes Samayah Novinger K, PA-C  amoxicillin-clavulanate (AUGMENTIN) 875-125 MG tablet  Take 1 tablet by mouth every 12 (twelve) hours. 11/18/20  Yes Sonnie Pawloski K, PA-C  predniSONE (STERAPRED UNI-PAK 21 TAB) 10 MG (21) TBPK tablet As directed 11/18/20  Yes Luan Urbani K, PA-C  FLUoxetine (PROZAC) 10 MG capsule Take 10 mg by mouth every morning. 11/19/16   [provider]  ketoconazole (NIZORAL) 2 % shampoo Apply 1 application topically 2 (two) times a week. Leave on three to five minutes then rinse 12/05/18   Westley Chandler, MD  phenazopyridine (PYRIDIUM) 200 MG tablet Take 1 tablet (200 mg total) 3 (three) times daily by mouth. 05/09/17   Kellie Shropshire, PA-C    Family History Family History  Problem Relation Age of Onset  . Mental illness Father     Social History Social History   Tobacco Use  . Smoking status: Never Smoker  . Smokeless tobacco: Never Used  Substance Use Topics  . Alcohol use: No  . Drug use: No     Allergies   Patient has no known allergies.   Review of Systems Review of Systems  Constitutional: Negative for activity change, appetite change, fatigue and fever.  HENT: Positive for congestion and sinus pressure. Negative for sneezing and sore throat.   Respiratory: Positive for cough, chest tightness and shortness of breath. Negative for wheezing.   Cardiovascular: Negative for chest pain.  Gastrointestinal: Negative for abdominal pain, diarrhea, nausea and vomiting.  Musculoskeletal: Negative for arthralgias and myalgias.  Neurological: Negative for dizziness, light-headedness and headaches.  Physical Exam Triage Vital Signs ED Triage Vitals  Enc Vitals Group     BP 11/18/20 1215 109/72     Pulse Rate 11/18/20 1215 63     Resp 11/18/20 1215 17     Temp 11/18/20 1215 99.1 F (37.3 C)     Temp Source 11/18/20 1215 Oral     SpO2 11/18/20 1215 98 %     Weight --      Height --      Head Circumference --      Peak Flow --      Pain Score 11/18/20 1214 0     Pain Loc --      Pain Edu? --      Excl. in GC? --    No  data found.  Updated Vital Signs BP 109/72 (BP Location: Right Arm)   Pulse 63   Temp 99.1 F (37.3 C) (Oral)   Resp 17   LMP 11/04/2020 (Approximate)   SpO2 98%   Visual Acuity Right Eye Distance:   Left Eye Distance:   Bilateral Distance:    Right Eye Near:   Left Eye Near:    Bilateral Near:     Physical Exam Vitals reviewed.  Constitutional:      General: She is awake. She is not in acute distress.    Appearance: Normal appearance. She is not ill-appearing.     Comments: Very pleasant female appears stated age in no acute distress  HENT:     Head: Normocephalic and atraumatic.     Right Ear: Tympanic membrane, ear canal and external ear normal. Tympanic membrane is not erythematous or bulging.     Left Ear: Tympanic membrane, ear canal and external ear normal. Tympanic membrane is not erythematous or bulging.     Nose:     Right Sinus: No maxillary sinus tenderness or frontal sinus tenderness.     Left Sinus: No maxillary sinus tenderness or frontal sinus tenderness.     Mouth/Throat:     Pharynx: Uvula midline. No oropharyngeal exudate or posterior oropharyngeal erythema.  Cardiovascular:     Rate and Rhythm: Normal rate and regular rhythm.     Heart sounds: No murmur heard.   Pulmonary:     Effort: Pulmonary effort is normal.     Breath sounds: Normal breath sounds. No wheezing, rhonchi or rales.     Comments: Clear to auscultation bilaterally; decreased aeration at bases bilaterally Lymphadenopathy:     Head:     Right side of head: No submental, submandibular or tonsillar adenopathy.     Left side of head: No submental, submandibular or tonsillar adenopathy.     Cervical: No cervical adenopathy.  Psychiatric:        Behavior: Behavior is cooperative.      UC Treatments / Results  Labs (all labs ordered are listed, but only abnormal results are displayed) Labs Reviewed - No data to display  EKG   Radiology No results  found.  Procedures Procedures (including critical care time)  Medications Ordered in UC Medications - No data to display  Initial Impression / Assessment and Plan / UC Course  I have reviewed the triage vital signs and the nursing notes.  Pertinent labs & imaging results that were available during my care of the patient were reviewed by me and considered in my medical decision making (see chart for details).     Patient started on Augmentin given prolonged and worsening symptoms.  She was started  on prednisone with instructions to take NSAIDs with this medication.  She was given albuterol inhaler.  Discussed that she may need maintenance medication such as Symbicort if symptoms persist but this would need to be arranged through PCP.  Recommended she use over-the-counter medications including Flonase and Mucinex for symptom relief.  Recommended she drink plenty of fluid.  Strict return precautions given to which patient expressed understanding.  Final Clinical Impressions(s) / UC Diagnoses   Final diagnoses:  Sinobronchitis  Cough     Discharge Instructions     Use albuterol as needed for shortness of breath symptoms.  Start prednisone taper as we discussed.  Do not take NSAIDs (aspirin, ibuprofen/Advil, naproxen/Aleve) with this medication as this can cause stomach bleeding.  You can take Tylenol.  Start Augmentin twice a day to cover for sinus infection.  Drink plenty of fluids.  If anything worsens please return for reevaluation.    ED Prescriptions    Medication Sig Dispense Auth. Provider   predniSONE (STERAPRED UNI-PAK 21 TAB) 10 MG (21) TBPK tablet As directed 21 tablet Ruairi Stutsman K, PA-C   amoxicillin-clavulanate (AUGMENTIN) 875-125 MG tablet Take 1 tablet by mouth every 12 (twelve) hours. 14 tablet Sana Tessmer K, PA-C   albuterol (VENTOLIN HFA) 108 (90 Base) MCG/ACT inhaler Inhale 1-2 puffs into the lungs every 6 (six) hours as needed for wheezing or shortness of breath.  8 g Adreanna Fickel, Noberto Retort, PA-C     PDMP not reviewed this encounter.   Jeani Hawking, PA-C 11/18/20 1303

## 2020-11-18 NOTE — Discharge Instructions (Addendum)
Use albuterol as needed for shortness of breath symptoms.  Start prednisone taper as we discussed.  Do not take NSAIDs (aspirin, ibuprofen/Advil, naproxen/Aleve) with this medication as this can cause stomach bleeding.  You can take Tylenol.  Start Augmentin twice a day to cover for sinus infection.  Drink plenty of fluids.  If anything worsens please return for reevaluation.

## 2021-12-09 ENCOUNTER — Encounter: Payer: Self-pay | Admitting: *Deleted

## 2022-08-20 ENCOUNTER — Ambulatory Visit: Payer: Medicaid Other | Admitting: Family Medicine
